# Patient Record
Sex: Female | Born: 1960 | Race: White | Hispanic: No | Smoking: Current every day smoker
Health system: Southern US, Community
[De-identification: ages and names within clinical notes are randomized; demographics above are authoritative.]

---

## 2015-10-08 DIAGNOSIS — M19019 Primary osteoarthritis, unspecified shoulder: Secondary | ICD-10-CM | POA: Insufficient documentation

## 2015-10-08 DIAGNOSIS — M7550 Bursitis of unspecified shoulder: Secondary | ICD-10-CM | POA: Insufficient documentation

## 2015-11-13 ENCOUNTER — Ambulatory Visit: Payer: Medicaid Other | Admitting: Family Medicine

## 2015-11-20 ENCOUNTER — Ambulatory Visit: Payer: Medicaid Other | Admitting: Family Medicine

## 2015-12-08 ENCOUNTER — Encounter: Payer: Self-pay | Admitting: Family Medicine

## 2015-12-08 ENCOUNTER — Ambulatory Visit (INDEPENDENT_AMBULATORY_CARE_PROVIDER_SITE_OTHER): Payer: Medicare Other | Admitting: Family Medicine

## 2015-12-08 ENCOUNTER — Ambulatory Visit: Payer: Medicaid Other | Admitting: Family Medicine

## 2015-12-08 VITALS — BP 128/81 | HR 61 | Ht 62.0 in | Wt 160.0 lb

## 2015-12-08 DIAGNOSIS — M25511 Pain in right shoulder: Secondary | ICD-10-CM

## 2015-12-08 DIAGNOSIS — M25512 Pain in left shoulder: Secondary | ICD-10-CM

## 2015-12-08 NOTE — Patient Instructions (Addendum)
I'm concerned about your rotator cuff muscles (specifically the supraspinatus). Your right proximal biceps tendon looks irregular as well. We will go ahead with MRIs to assess the health of these muscles. In meantime I would take ibuprofen 600mg  three times a day OR aleve 2 tabs twice a day with food for pain and inflammation if you can tolerate either of these. Heat typically 15 minutes at a time 3-4 times a day. Topical capsaicin, aspercreme or biofreeze up to 4 times a day. I would wait on the MRI results before moving forward with other treatment (injections, physical therapy, possible surgery).

## 2015-12-13 ENCOUNTER — Ambulatory Visit (HOSPITAL_BASED_OUTPATIENT_CLINIC_OR_DEPARTMENT_OTHER): Payer: Medicare Other

## 2015-12-16 DIAGNOSIS — M25512 Pain in left shoulder: Secondary | ICD-10-CM

## 2015-12-16 DIAGNOSIS — M25511 Pain in right shoulder: Secondary | ICD-10-CM | POA: Insufficient documentation

## 2015-12-16 NOTE — Progress Notes (Addendum)
PCP: No primary care provider on file.  Subjective:   HPI: Patient is a 55 y.o. female here for bilateral shoulder pain.  Patient reports she's had pain in left shoulder since May when she lifted an old wooden fence and heard/felt two cracks in shoulder. Per patient she had x-rays that were reassuring and the ortho wanted to send her to physical therapy but she did not go.   Also injured right shoulder in a fall June of this year. Remotely also injured this shoulder when fell off a stepladder - did PT, no surgery, never completely improved. She reports being on partial disability due to this right shoulder. She had injections into AC and subacromial regions per note. She states both shoulders bother with driving, sleeping. Pain is 7/10 on left, 9/10 on right and severe. Taking ibuprofen and tylenol.  No past medical history on file.  No current outpatient prescriptions on file prior to visit.   No current facility-administered medications on file prior to visit.     No past surgical history on file.  Allergies  Allergen Reactions  . Penicillins   . Tetracyclines & Related   . Tramadol     Social History   Social History  . Marital status: Divorced    Spouse name: N/A  . Number of children: N/A  . Years of education: N/A   Occupational History  . Not on file.   Social History Main Topics  . Smoking status: Current Every Day Smoker    Packs/day: 1.00    Types: Cigarettes  . Smokeless tobacco: Never Used  . Alcohol use Not on file  . Drug use: Unknown  . Sexual activity: Not on file   Other Topics Concern  . Not on file   Social History Narrative  . No narrative on file    No family history on file.  BP 128/81   Pulse 61   Ht 5\' 2"  (1.575 m)   Wt 160 lb (72.6 kg)   BMI 29.26 kg/m   Review of Systems: See HPI above.    Objective:  Physical Exam:  Gen: NAD, comfortable in exam room  Right shoulder: No swelling, ecchymoses.  No gross  deformity. Mild anterior and lateral tenderness. ROM limited - full IR.  Abduction only to 80 degrees, ER to 30 degrees. Painful Ananias Pilgrim. Negative Yergasons. Strength 4/5 with empty can and 5/5 resisted internal/external rotation. Negative apprehension. NV intact distally.  Left shoulder: No swelling, ecchymoses.  No gross deformity. Mild anterior and lateral tenderness. ROM limited - full IR, ER to 40 degrees, abduction and flexion 90 degrees. Positive Hawkins, Neers. Negative Yergasons. Strength 4/5 with empty can and 5/5 resisted internal/external rotation. Negative apprehension. NV intact distally.  MSK u/s:  Both supraspinatus muscles appear irregular with decreased definition, hypoechoic area on long views.  Right biceps tendon is also small, more indistinct than left which appears normal.  Other rotator cuff muscles appear normal.    Assessment & Plan:  1. Bilateral shoulder pain - s/p AC and subacromial injections from ortho.  She has impairment of right shoulder from prior injury so unsure about baseline, prior problem before current pain.  We discussed options - will go ahead with MRIs given not improving with conservative measures and concern for supraspinatus muscle tears on exam and ultrasound.  Ibuprofen or aleve, heat, topical medications.    Addendum:  MRIs reviewed and discussed with patient.  She has bilateral complete rotator cuff tears.  The supraspinatus and  infraspinatus tears on the right appear old, atrophied and irreparable.  The subscapularis appears new though 2-3 cm retracted.  On left these also appear subacute or acute (supraspinatus and infraspinatus) with 2.5-3.5 cm retraction.  Advised her we go ahead with ortho referral to discuss surgical options, if they think these can be repaired.  Will make the referral.  Given Rx for norco - she is on naltrexone from psych - advised her this may block the analgesic effects of the hydrocodone but she is willing to  try this.

## 2015-12-16 NOTE — Assessment & Plan Note (Signed)
s/p AC and subacromial injections from ortho.  She has impairment of right shoulder from prior injury so unsure about baseline, prior problem before current pain.  We discussed options - will go ahead with MRIs given not improving with conservative measures and concern for supraspinatus muscle tears on exam and ultrasound.  Ibuprofen or aleve, heat, topical medications.

## 2015-12-20 ENCOUNTER — Ambulatory Visit (HOSPITAL_BASED_OUTPATIENT_CLINIC_OR_DEPARTMENT_OTHER)
Admission: RE | Admit: 2015-12-20 | Discharge: 2015-12-20 | Disposition: A | Discharge status: Home / Self Care | Payer: Medicare Other | Source: Ambulatory Visit | Attending: Family Medicine | Admitting: Family Medicine

## 2015-12-20 ENCOUNTER — Encounter (INDEPENDENT_AMBULATORY_CARE_PROVIDER_SITE_OTHER): Payer: Self-pay

## 2015-12-20 DIAGNOSIS — M75121 Complete rotator cuff tear or rupture of right shoulder, not specified as traumatic: Secondary | ICD-10-CM | POA: Diagnosis not present

## 2015-12-20 DIAGNOSIS — M19011 Primary osteoarthritis, right shoulder: Secondary | ICD-10-CM | POA: Insufficient documentation

## 2015-12-20 DIAGNOSIS — M25511 Pain in right shoulder: Secondary | ICD-10-CM | POA: Diagnosis not present

## 2015-12-20 DIAGNOSIS — M7551 Bursitis of right shoulder: Secondary | ICD-10-CM | POA: Insufficient documentation

## 2015-12-20 DIAGNOSIS — M75122 Complete rotator cuff tear or rupture of left shoulder, not specified as traumatic: Secondary | ICD-10-CM | POA: Insufficient documentation

## 2015-12-20 DIAGNOSIS — M19012 Primary osteoarthritis, left shoulder: Secondary | ICD-10-CM | POA: Diagnosis not present

## 2015-12-20 DIAGNOSIS — M25512 Pain in left shoulder: Secondary | ICD-10-CM | POA: Insufficient documentation

## 2015-12-22 ENCOUNTER — Telehealth: Payer: Self-pay | Admitting: Family Medicine

## 2015-12-22 MED ORDER — HYDROCODONE-ACETAMINOPHEN 7.5-325 MG PO TABS: 1.0000 | ORAL_TABLET | Freq: Four times a day (QID) | ORAL | 0 refills | Status: AC | PRN

## 2015-12-22 MED FILL — HYDROCODON-APAP 7.5-325: 7.5-325 | 8 days supply | Qty: 30 | Fill #0

## 2015-12-22 NOTE — Addendum Note (Signed)
Addended by: Lenda KelpHUDNALL, Tityana Pagan R on: 12/22/2015 09:17 AM   Modules accepted: Orders

## 2015-12-22 NOTE — Telephone Encounter (Signed)
I'm ok with referral to him - please see my addendum to office visit re: MRI results - bilateral rotator cuff tears.  Thanks!

## 2016-02-09 ENCOUNTER — Telehealth: Payer: Self-pay | Admitting: Family Medicine

## 2016-02-09 NOTE — Telephone Encounter (Signed)
We cannot refill the hydrocodone - we could give her a short course of tramadol but it's listed that she is allergic to this.

## 2016-02-10 NOTE — Telephone Encounter (Signed)
Spoke to patient and told her that we could not refill the hydrocodone. Also advised patient that referral will be re-sent to ortho surgeon.

## 2017-08-29 IMAGING — MR MR SHOULDER*L* W/O CM
5 series · 40 of 40 positions shown · non-contrast
Comparison: None.

CLINICAL DATA: Chronic bilateral shoulder pain. History of left
shoulder lifting injury in September 2014.

EXAM:
MRI OF THE LEFT SHOULDER WITHOUT CONTRAST
TECHNIQUE: Multiplanar, multisequence MR imaging of the shoulder was performed.
No intravenous contrast was administered.

[Series 3: T2 fat-sat · axial · 4.0mm · 0.55mm/px · z∈[-36,+59]mm · 9 of 20 slices shown (1 of 3)]
[im 1/20]
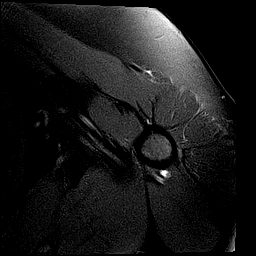
[im 3/20]
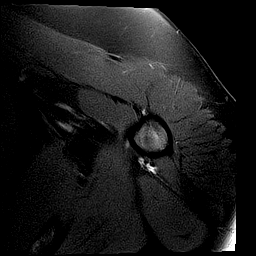
[im 5/20]
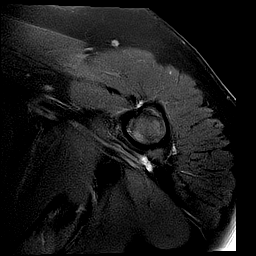
[im 8/20]
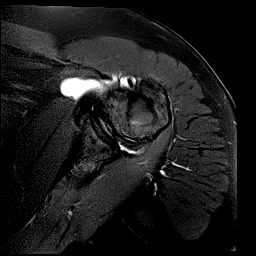
[im 10/20]
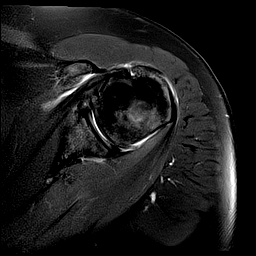
[im 12/20]
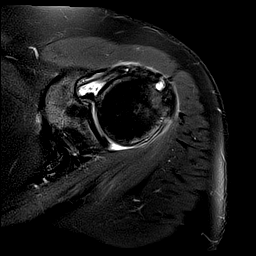
[im 15/20]
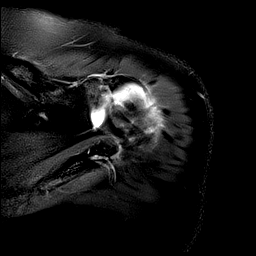
[im 17/20]
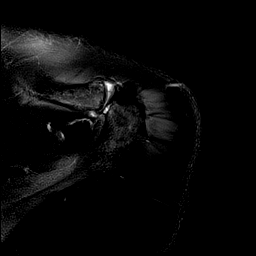
[im 20/20]
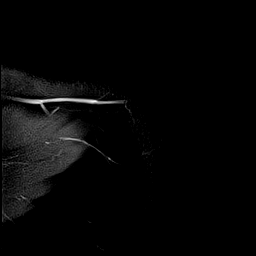

[Series 5: PD fat-sat · oblique · 4.0mm · 0.55mm/px · 8 of 18 slices shown]
[im 1/18]
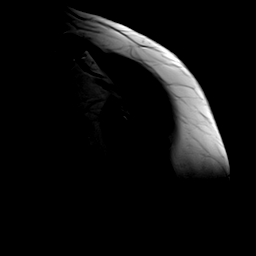
[im 3/18]
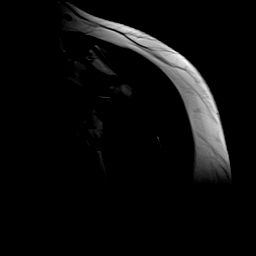
[im 5/18]
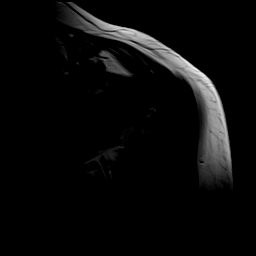
[im 8/18]
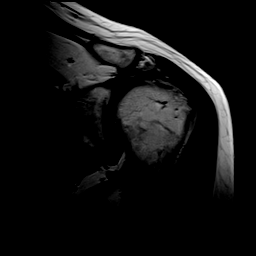
[im 10/18]
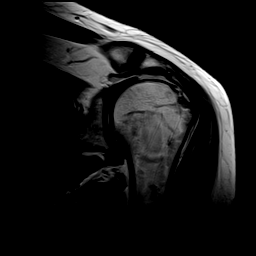
[im 13/18]
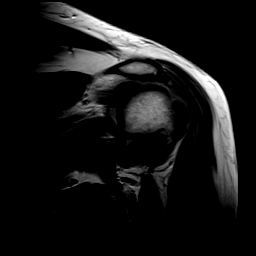
[im 15/18]
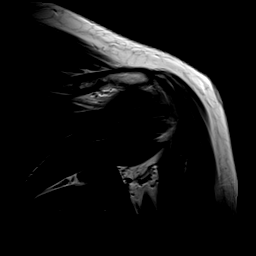
[im 18/18]
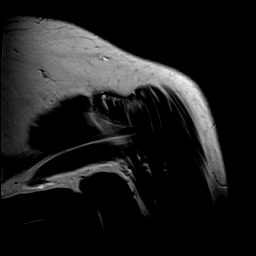

[Series 6: T2 fat-sat · sagittal · 4.0mm · 0.55mm/px · 7 of 16 slices shown (2 of 3)]
[im 1/16]
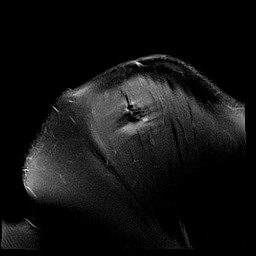
[im 3/16]
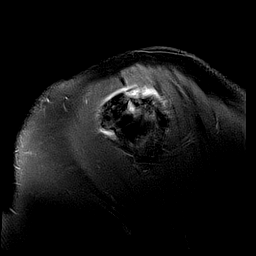
[im 6/16]
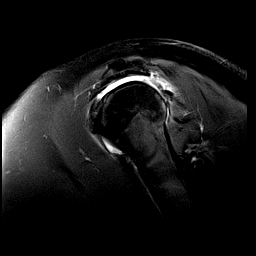
[im 8/16]
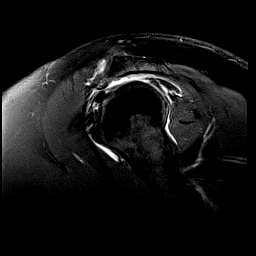
[im 11/16]
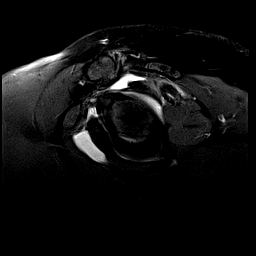
[im 13/16]
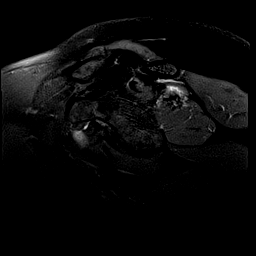
[im 16/16]
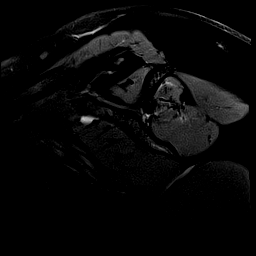

[Series 7: T1 · sagittal · 4.0mm · 0.55mm/px · 9 of 20 slices shown]
[im 1/20]
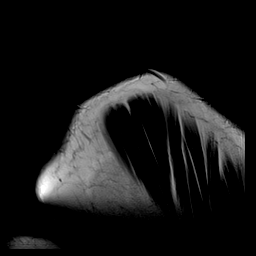
[im 3/20]
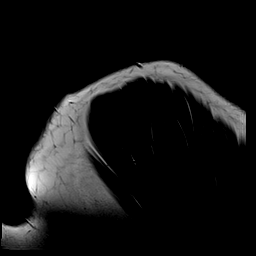
[im 5/20]
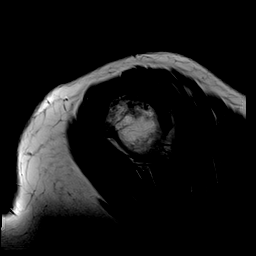
[im 8/20]
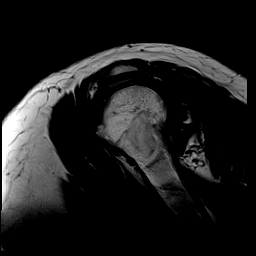
[im 10/20]
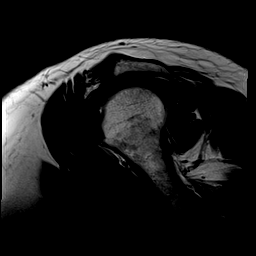
[im 12/20]
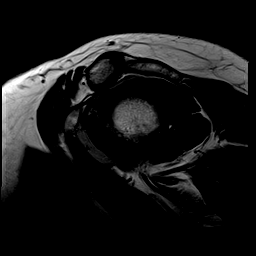
[im 15/20]
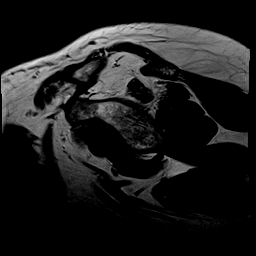
[im 17/20]
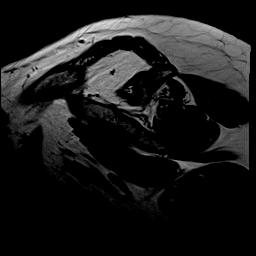
[im 20/20]
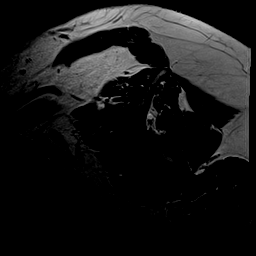

[Series 8: T2 fat-sat · oblique · 4.0mm · 0.55mm/px · 7 of 17 slices shown (3 of 3)]
[im 1/17]
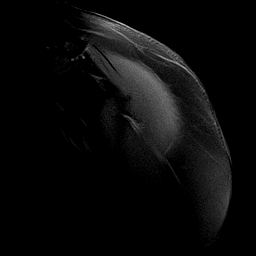
[im 3/17]
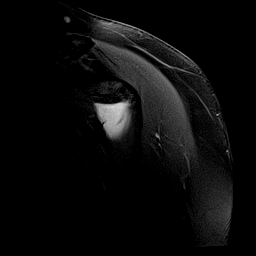
[im 6/17]
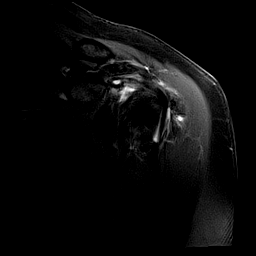
[im 9/17]
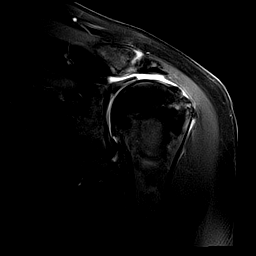
[im 11/17]
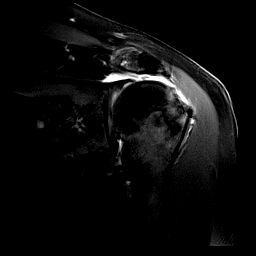
[im 14/17]
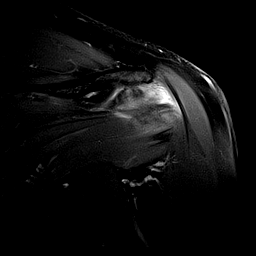
[im 17/17]
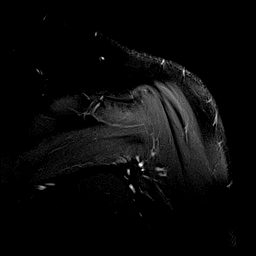

[40 of 40 positions shown; findings below may reference images not displayed]

FINDINGS: Rotator cuff: There is rotator cuff tendinopathy. The patient has
complete supraspinatus and near-complete infraspinatus tendon tears.
Both tendons are retracted medial to the top of the humeral head,
2.5-3.5 cm. A small band of the posterior most infraspinatus fibers
are intact. The subscapularis is intact.

Muscles:  No focal atrophy or lesion.

Biceps long head: Tendinopathy of the intra-articular segment
without tear is identified.

Acromioclavicular Joint: Advanced for age appearing moderately
severe degenerative disease is present.

Glenohumeral Joint: Small osteophyte is seen off the medial humeral
head.

Labrum:  Appears intact.

Bones: No fracture or worrisome marrow lesion. The acromion is type
2. Large volume of fluid is present in the subacromial/subdeltoid
bursa.

Other: None
IMPRESSION: Complete supraspinatus and near-complete infraspinatus tendon tears
with 2.5-3.5 cm of retraction but no notable atrophy.

Tendinopathy of the intra-articular long head of biceps without
tear.

Advanced for age appearing moderately severe acromioclavicular
osteoarthritis. Mild glenohumeral degenerative change is also seen.

Prominent subacromial/subdeltoid fluid consistent with bursitis.
